# Patient Record
Sex: Female | Born: 1980 | Race: White | Hispanic: No | Marital: Married | Smoking: Never smoker
Health system: Southern US, Community
[De-identification: ages and names within clinical notes are randomized; demographics above are authoritative.]

## PROBLEM LIST (undated history)

## (undated) ENCOUNTER — Inpatient Hospital Stay (HOSPITAL_COMMUNITY): Payer: Self-pay

## (undated) DIAGNOSIS — I499 Cardiac arrhythmia, unspecified: Secondary | ICD-10-CM

## (undated) DIAGNOSIS — O021 Missed abortion: Secondary | ICD-10-CM

## (undated) DIAGNOSIS — E039 Hypothyroidism, unspecified: Secondary | ICD-10-CM

## (undated) DIAGNOSIS — Z789 Other specified health status: Secondary | ICD-10-CM

---

## 2013-03-06 ENCOUNTER — Encounter (HOSPITAL_BASED_OUTPATIENT_CLINIC_OR_DEPARTMENT_OTHER): Payer: Self-pay | Admitting: *Deleted

## 2013-03-06 NOTE — Progress Notes (Addendum)
NPO AFTER MN. ARRIVE AT 0600. PRE-ORDERS PENDING.  NEEDS HG.

## 2013-03-07 NOTE — H&P (Signed)
NAME:  TRAYCE, CARAVELLO NO.:  [id number]  MEDICAL RECORD NO.:  [id number]  LOCATION:                                 FACILITY:  PHYSICIAN:  Zelphia Cairo, MD         DATE OF BIRTH:  DATE OF ADMISSION: DATE OF DISCHARGE:                             HISTORY & PHYSICAL   HISTORY OF PRESENT ILLNESS:  A 32 year old, G1, P0, at approximately [redacted] weeks gestation by date presents for surgical management of missed abortion.  PAST MEDICAL HISTORY:  Negative.  PAST SURGICAL HISTORY:  Negative.  ALLERGIES:  PENICILLIN.  FAMILY HISTORY:  Melanoma, lung cancer, diabetes and hypertension.  MEDICATIONS:  Prenatal vitamin.  SOCIAL HISTORY:  Negative for tobacco, alcohol, and drug use.  PHYSICAL EXAMINATION:  VITAL SIGNS:  Afebrile.  Vital signs stable. Blood pressure 102/62.  Blood type B positive, antibody negative. GENERAL:  She is in no acute distress. HEART:  Regular rate and rhythm. LUNGS:  Clear bilaterally. ABDOMEN:  Soft, nontender, nondistended. PELVIC:  Deferred. EXTREMITIES:  Without clubbing, cyanosis, or edema.  Ultrasound shows intrauterine pregnancy measuring 8 weeks and 5 days. No cardiac motion is identified.  ASSESSMENT:  Missed abortion.  PLAN:  Dilation and evacuation.  Risks benefits, alternatives, and implications to future pregnancies were reviewed.  Informed consent obtained.     Zelphia Cairo, MD     GA/MEDQ  D:  03/06/2013  T:  03/07/2013  Job:  [id number]

## 2013-03-09 ENCOUNTER — Encounter (HOSPITAL_BASED_OUTPATIENT_CLINIC_OR_DEPARTMENT_OTHER)
Admission: RE | Disposition: A | Discharge status: Home / Self Care | Payer: Self-pay | Source: Ambulatory Visit | Attending: Obstetrics and Gynecology

## 2013-03-09 ENCOUNTER — Ambulatory Visit (HOSPITAL_BASED_OUTPATIENT_CLINIC_OR_DEPARTMENT_OTHER)
Admission: RE | Admit: 2013-03-09 | Discharge: 2013-03-09 | Disposition: A | Discharge status: Home / Self Care | Payer: No Typology Code available for payment source | Source: Ambulatory Visit | Attending: Obstetrics and Gynecology | Admitting: Obstetrics and Gynecology

## 2013-03-09 ENCOUNTER — Ambulatory Visit (HOSPITAL_BASED_OUTPATIENT_CLINIC_OR_DEPARTMENT_OTHER): Payer: No Typology Code available for payment source | Admitting: Anesthesiology

## 2013-03-09 ENCOUNTER — Encounter (HOSPITAL_BASED_OUTPATIENT_CLINIC_OR_DEPARTMENT_OTHER): Payer: No Typology Code available for payment source | Admitting: Anesthesiology

## 2013-03-09 ENCOUNTER — Encounter (HOSPITAL_BASED_OUTPATIENT_CLINIC_OR_DEPARTMENT_OTHER): Payer: Self-pay | Admitting: *Deleted

## 2013-03-09 DIAGNOSIS — O021 Missed abortion: Secondary | ICD-10-CM | POA: Insufficient documentation

## 2013-03-09 HISTORY — PX: DILATION AND EVACUATION: SHX1459

## 2013-03-09 HISTORY — DX: Missed abortion: O02.1

## 2013-03-09 LAB — POCT HEMOGLOBIN-HEMACUE: Hemoglobin: 14.1 g/dL (ref 12.0–15.0)

## 2013-03-09 SURGERY — DILATION AND EVACUATION, UTERUS
Anesthesia: General | Site: Uterus | Wound class: Clean Contaminated

## 2013-03-09 MED ORDER — LACTATED RINGERS IV SOLN
INTRAVENOUS | Status: DC
  Administered 2013-03-09: 06:00:00 via INTRAVENOUS
  Filled 2013-03-09: qty 1000

## 2013-03-09 MED ORDER — FENTANYL CITRATE 0.05 MG/ML IJ SOLN
25.0000 ug | INTRAMUSCULAR | Status: DC | PRN
  Filled 2013-03-09: qty 1

## 2013-03-09 MED ORDER — PROPOFOL 10 MG/ML IV BOLUS
INTRAVENOUS | Status: DC | PRN
  Administered 2013-03-09: 180 mg via INTRAVENOUS

## 2013-03-09 MED ORDER — FENTANYL CITRATE 0.05 MG/ML IJ SOLN
INTRAMUSCULAR | Status: DC | PRN
  Administered 2013-03-09: 50 ug via INTRAVENOUS

## 2013-03-09 MED ORDER — METHYLERGONOVINE MALEATE 0.2 MG PO TABS: 0.2000 mg | ORAL_TABLET | Freq: Three times a day (TID) | ORAL | Status: DC

## 2013-03-09 MED ORDER — DEXAMETHASONE SODIUM PHOSPHATE 4 MG/ML IJ SOLN
INTRAMUSCULAR | Status: DC | PRN
  Administered 2013-03-09: 8 mg via INTRAVENOUS

## 2013-03-09 MED ORDER — MIDAZOLAM HCL 5 MG/5ML IJ SOLN
INTRAMUSCULAR | Status: DC | PRN
  Administered 2013-03-09: 2 mg via INTRAVENOUS

## 2013-03-09 MED ORDER — KETOROLAC TROMETHAMINE 30 MG/ML IJ SOLN
INTRAMUSCULAR | Status: DC | PRN
  Administered 2013-03-09: 30 mg via INTRAVENOUS

## 2013-03-09 MED ORDER — ONDANSETRON HCL 4 MG/2ML IJ SOLN
INTRAMUSCULAR | Status: DC | PRN
  Administered 2013-03-09: 4 mg via INTRAMUSCULAR

## 2013-03-09 MED ORDER — CLINDAMYCIN PHOSPHATE 900 MG/50ML IV SOLN
900.0000 mg | Freq: Once | INTRAVENOUS | Status: DC
  Filled 2013-03-09: qty 50

## 2013-03-09 MED ORDER — LACTATED RINGERS IV SOLN
INTRAVENOUS | Status: DC
  Filled 2013-03-09: qty 1000

## 2013-03-09 MED ORDER — CHLOROPROCAINE HCL 1 % IJ SOLN
INTRAMUSCULAR | Status: DC | PRN
  Administered 2013-03-09: 10 mL

## 2013-03-09 MED ORDER — HYDROCODONE-IBUPROFEN 5-200 MG PO TABS: 1.0000 | ORAL_TABLET | Freq: Four times a day (QID) | ORAL | Status: AC | PRN

## 2013-03-09 MED ORDER — LIDOCAINE HCL (CARDIAC) 20 MG/ML IV SOLN
INTRAVENOUS | Status: DC | PRN
  Administered 2013-03-09: 60 mg via INTRAVENOUS

## 2013-03-09 SURGICAL SUPPLY — 23 items
CATH ROBINSON RED A/P 16FR (CATHETERS) ×2 IMPLANT
CLOTH BEACON ORANGE TIMEOUT ST (SAFETY) ×2 IMPLANT
COVER TABLE BACK 60X90 (DRAPES) ×2 IMPLANT
DRAPE LG THREE QUARTER DISP (DRAPES) ×2 IMPLANT
DRESSING TELFA 8X3 (GAUZE/BANDAGES/DRESSINGS) ×2 IMPLANT
GLOVE BIO SURGEON STRL SZ 6.5 (GLOVE) ×2 IMPLANT
GLOVE BIOGEL M 6.5 STRL (GLOVE) ×4 IMPLANT
GLOVE BIOGEL M STER SZ 6 (GLOVE) ×2 IMPLANT
GLOVE INDICATOR 7.0 STRL GRN (GLOVE) ×4 IMPLANT
GOWN PREVENTION PLUS LG XLONG (DISPOSABLE) ×2 IMPLANT
GOWN STRL REIN XL XLG (GOWN DISPOSABLE) ×2 IMPLANT
LEGGING LITHOTOMY PAIR STRL (DRAPES) ×2 IMPLANT
NEEDLE SPNL 22GX3.5 QUINCKE BK (NEEDLE) ×2 IMPLANT
NS IRRIG 500ML POUR BTL (IV SOLUTION) ×2 IMPLANT
PACK BASIN DAY SURGERY FS (CUSTOM PROCEDURE TRAY) ×2 IMPLANT
PAD OB MATERNITY 4.3X12.25 (PERSONAL CARE ITEMS) ×2 IMPLANT
PAD PREP 24X48 CUFFED NSTRL (MISCELLANEOUS) ×2 IMPLANT
SURGILUBE 2OZ TUBE FLIPTOP (MISCELLANEOUS) ×2 IMPLANT
SYR CONTROL 10ML LL (SYRINGE) ×2 IMPLANT
TOWEL OR 17X24 6PK STRL BLUE (TOWEL DISPOSABLE) ×2 IMPLANT
TRAY DSU PREP LF (CUSTOM PROCEDURE TRAY) ×2 IMPLANT
VACURETTE 9 RIGID CVD (CANNULA) ×2 IMPLANT
WATER STERILE IRR 500ML POUR (IV SOLUTION) ×2 IMPLANT

## 2013-03-09 NOTE — Transfer of Care (Signed)
Immediate Anesthesia Transfer of Care Note  Patient: Annette Banks  Procedure(s) Performed: Procedure(s) (LRB): DILATATION AND EVACUATION (N/A)  Patient Location: PACU  Anesthesia Type: General  Level of Consciousness: awake, oriented, sedated and patient cooperative  Airway & Oxygen Therapy: Patient Spontanous Breathing and Patient connected to face mask oxygen  Post-op Assessment: Report given to PACU RN and Post -op Vital signs reviewed and stable  Post vital signs: Reviewed and stable  Complications: No apparent anesthesia complications

## 2013-03-09 NOTE — Op Note (Signed)
NAMEMekaela, Annette Banks NO.:  [id number]  MEDICAL RECORD NO.:  [id number]  LOCATION:                                 FACILITY:  PHYSICIAN:  Zelphia Cairo, MD         DATE OF BIRTH:  DATE OF PROCEDURE:  03/09/2013 DATE OF DISCHARGE:                              OPERATIVE REPORT   PREOPERATIVE DIAGNOSIS:  Missed abortion.  POSTOPERATIVE DIAGNOSIS:  Missed abortion.  PROCEDURE: 1. Cervical block. 2. Dilation and evacuation.  SURGEON:  Zelphia Cairo, MD  ANESTHESIA:  General.  COMPLICATIONS:  None.  SPECIMEN:  Products of conception.  CONDITION:  Stable to recovery room.  PROCEDURE IN DETAIL:  The patient was taken to the operating room, where general anesthesia was obtained, she was placed in the dorsal lithotomy position using Allen stirrups, prepped and draped in sterile fashion. Bivalve speculum was placed in the vagina and 1 mL of 1% Nesacaine was placed at the anterior lip of the cervix.  Tenaculum was attached to the anterior lip of the cervix and the remaining 9 mL of Nesacaine was used to perform a cervical block.  The cervix was easily dilated and a 9- French suction catheter was used to perform D any E.  Once no further tissue was being obtained, gentle curetting was performed to ensure uterine cry throughout.  The suction curette was introduced one last time to remove any clots and debris.  No further tissue was obtained. Tenaculum was removed from the cervix.  The cervix was hemostatic. Speculum was removed.  The patient was extubated and taken to the recovery room in stable condition.  Sponge, lap, needle, and instrument counts were correct x2.     Zelphia Cairo, MD     GA/MEDQ  D:  03/09/2013  T:  03/09/2013  Job:  [id number]

## 2013-03-09 NOTE — Anesthesia Postprocedure Evaluation (Signed)
  Anesthesia Post-op Note  Patient: Annette Banks  Procedure(s) Performed: Procedure(s) (LRB): DILATATION AND EVACUATION (N/A)  Patient Location: PACU  Anesthesia Type: General  Level of Consciousness: awake and alert   Airway and Oxygen Therapy: Patient Spontanous Breathing  Post-op Pain: mild  Post-op Assessment: Post-op Vital signs reviewed, Patient's Cardiovascular Status Stable, Respiratory Function Stable, Patent Airway and No signs of Nausea or vomiting  Last Vitals:  Filed Vitals:   03/09/13 0810  BP:   Pulse: 65  Temp:   Resp: 15    Post-op Vital Signs: stable   Complications: No apparent anesthesia complications

## 2013-03-09 NOTE — Progress Notes (Signed)
H&P reviewed and unchanged.

## 2013-03-09 NOTE — Anesthesia Preprocedure Evaluation (Addendum)

## 2013-03-09 NOTE — Anesthesia Procedure Notes (Signed)
Procedure Name: LMA Insertion Date/Time: 03/09/2013 7:31 AM Performed by: Renella Cunas D Pre-anesthesia Checklist: Patient identified, Emergency Drugs available, Suction available and Patient being monitored Patient Re-evaluated:Patient Re-evaluated prior to inductionOxygen Delivery Method: Circle System Utilized Preoxygenation: Pre-oxygenation with 100% oxygen Intubation Type: IV induction Ventilation: Mask ventilation without difficulty LMA: LMA inserted LMA Size: 4.0 Number of attempts: 1 Airway Equipment and Method: bite block Placement Confirmation: positive ETCO2 Tube secured with: Tape Dental Injury: Teeth and Oropharynx as per pre-operative assessment

## 2013-03-10 ENCOUNTER — Encounter (HOSPITAL_BASED_OUTPATIENT_CLINIC_OR_DEPARTMENT_OTHER): Payer: Self-pay | Admitting: Obstetrics and Gynecology

## 2013-03-10 ENCOUNTER — Ambulatory Visit: Admit: 2013-03-10 | Payer: Self-pay | Admitting: Obstetrics and Gynecology

## 2013-03-10 SURGERY — DILATION AND EVACUATION, UTERUS
Anesthesia: Choice

## 2013-11-01 ENCOUNTER — Encounter (HOSPITAL_COMMUNITY): Payer: Self-pay | Admitting: *Deleted

## 2013-11-01 ENCOUNTER — Inpatient Hospital Stay (HOSPITAL_COMMUNITY)
Admission: AD | Admit: 2013-11-01 | Discharge: 2013-11-01 | Disposition: A | Discharge status: Home / Self Care | Payer: No Typology Code available for payment source | Source: Ambulatory Visit | Attending: Obstetrics and Gynecology | Admitting: Obstetrics and Gynecology

## 2013-11-01 ENCOUNTER — Inpatient Hospital Stay (HOSPITAL_COMMUNITY): Payer: No Typology Code available for payment source

## 2013-11-01 DIAGNOSIS — O209 Hemorrhage in early pregnancy, unspecified: Secondary | ICD-10-CM

## 2013-11-01 DIAGNOSIS — O208 Other hemorrhage in early pregnancy: Secondary | ICD-10-CM | POA: Insufficient documentation

## 2013-11-01 HISTORY — DX: Other specified health status: Z78.9

## 2013-11-01 NOTE — MAU Note (Signed)
PT  SAYS SHE STARTED VAG  BLEEDING AT   0045-   ALSO HAD  SMALL AMT ON TOILET PAPER BEFORE THIS.    IN OFFICE   ON 6-4-  U/S- ALL OK.   NO CRAMPING.    IN TRIAGE -  SMALL AMT   STREAK.    LAST SEX-   8  WEEKS AGO.

## 2013-11-01 NOTE — Discharge Instructions (Signed)
Vaginal Bleeding During Pregnancy, First Trimester °A small amount of bleeding (spotting) from the vagina is relatively common in early pregnancy. It usually stops on its own. Various things may cause bleeding or spotting in early pregnancy. Some bleeding may be related to the pregnancy, and some may not. In most cases, the bleeding is normal and is not a problem. However, bleeding can also be a sign of something serious. Be sure to tell your health care provider about any vaginal bleeding right away. °Some possible causes of vaginal bleeding during the first trimester include: °· Infection or inflammation of the cervix. °· Growths (polyps) on the cervix. °· Miscarriage or threatened miscarriage. °· Pregnancy tissue has developed outside of the uterus and in a fallopian tube (tubal pregnancy). °· Tiny cysts have developed in the uterus instead of pregnancy tissue (molar pregnancy). °HOME CARE INSTRUCTIONS  °Watch your condition for any changes. The following actions may help to lessen any discomfort you are feeling: °· Follow your health care provider's instructions for limiting your activity. If your health care provider orders bed rest, you may need to stay in bed and only get up to use the bathroom. However, your health care provider may allow you to continue light activity. °· If needed, make plans for someone to help with your regular activities and responsibilities while you are on bed rest. °· Keep track of the number of pads you use each day, how often you change pads, and how soaked (saturated) they are. Write this down. °· Do not use tampons. Do not douche. °· Do not have sexual intercourse or orgasms until approved by your health care provider. °· If you pass any tissue from your vagina, save the tissue so you can show it to your health care provider. °· Only take over-the-counter or prescription medicines as directed by your health care provider. °· Do not take aspirin because it can make you  bleed. °· Keep all follow-up appointments as directed by your health care provider. °SEEK MEDICAL CARE IF: °· You have any vaginal bleeding during any part of your pregnancy. °· You have cramps or labor pains. °SEEK IMMEDIATE MEDICAL CARE IF:  °· You have severe cramps in your back or belly (abdomen). °· You have a fever, not controlled by medicine. °· You pass large clots or tissue from your vagina. °· Your bleeding increases. °· You feel lightheaded or weak, or you have fainting episodes. °· You have chills. °· You are leaking fluid or have a gush of fluid from your vagina. °· You pass out while having a bowel movement. °MAKE SURE YOU: °· Understand these instructions. °· Will watch your condition. °· Will get help right away if you are not doing well or get worse. °Document Released: 02/21/2005 Document Revised: 03/04/2013 Document Reviewed: 01/19/2013 °Exit[employer]® Patient Information ©2014 [employer]. ° °

## 2013-11-01 NOTE — MAU Provider Note (Signed)
History     CSN: [id number]  Arrival date and time: 11/01/13 0215   None     No chief complaint on file.  HPI This is a 33 y.o. female at 3164w4d who presents with c/o red vaginal bleeding at home. Has had one episode of spotting before but this filled toilet. Has no pain. Is also concerned over intermittent episodes of heart fluttering. It is not happening tonight, but has been worried. Her doctor has talked about referring her to cardiology. Worried the palpitations will hurt the baby.  She is tearful when describing this, and states "I am just so scared about everything this pregnancy".   Rn Note PT SAYS SHE STARTED VAG BLEEDING AT 0045- ALSO HAD SMALL AMT ON TOILET PAPER BEFORE THIS. IN OFFICE ON 6-4- U/S- ALL OK. NO CRAMPING. IN TRIAGE - SMALL AMT STREAK. LAST SEX- 8 WEEKS AGO.        OB History   Grav Para Term Preterm Abortions TAB SAB Ect Mult Living   2    1  1    0      Past Medical History  Diagnosis Date  . Missed abortion   . Medical history non-contributory     Past Surgical History  Procedure Laterality Date  . Dilation and evacuation N/A 03/09/2013    Procedure: DILATATION AND EVACUATION;  Surgeon: Annette CairoGretchen Adkins, MD;  [facility] [place];  Service: Gynecology;  Laterality: N/A;    History reviewed. No pertinent family history.  History  Substance Use Topics  . Smoking status: Never Smoker   . Smokeless tobacco: Never Used  . Alcohol Use: No    Allergies:  Allergies  Allergen Reactions  . Penicillins Other (See Comments)    SEIZURE AS CHILD    Prescriptions prior to admission  Medication Sig Dispense Refill  . hydrocodone-ibuprofen (VICOPROFEN) 5-200 MG per tablet Take 1-2 tablets by mouth every 6 (six) hours as needed for pain.  30 tablet  0  . methylergonovine (METHERGINE) 0.2 MG tablet Take 1 tablet (0.2 mg total) by mouth every 8 (eight) hours.  9 tablet  0    Review of Systems  Constitutional: Negative for fever, chills  and malaise/fatigue.  Gastrointestinal: Negative for nausea, vomiting and abdominal pain.  Genitourinary:       Vaginal bleeding   Neurological: Negative for dizziness.   Physical Exam   Blood pressure 110/59, pulse 84, temperature 98.7 F (37.1 C), temperature source Oral, resp. rate 18, height 5' (1.524 m), weight 60.952 kg (134 lb 6 oz), last menstrual period 09/02/2013.  Physical Exam  Constitutional: She is oriented to person, place, and time. She appears well-developed and well-nourished. No distress.  Cardiovascular: Normal rate, regular rhythm and normal heart sounds.  Exam reveals no gallop and no friction rub.   No murmur heard. Respiratory: Effort normal. No respiratory distress. She has no wheezes. She has no rales.  GI: Soft. There is no tenderness.  Genitourinary: Uterus normal. Vaginal discharge (small to moderate clotted blood in vault) found.  Musculoskeletal: Normal range of motion.  Neurological: She is alert and oriented to person, place, and time.  Skin: Skin is warm and dry.  Psychiatric: She has a normal mood and affect.    MAU Course  Procedures  MDM [place] Ob Comp Less 14 Wks  11/01/2013   CLINICAL DATA:  Vaginal bleeding.  EXAM: OBSTETRIC <14 WK US AND TRANSVAGINAL OB US  TECHNIQUE: Both transabdominal and transvaginal ultrasound examinations were performed for complete  evaluation of the gestation as well as the maternal uterus, adnexal regions, and pelvic cul-de-sac. Transvaginal technique was performed to assess early pregnancy.  COMPARISON:  None.    FINDINGS: Intrauterine gestational sac: Visualized/normal in shape.  Yolk sac:  Present  Embryo:  Present  Cardiac Activity: Present  Heart Rate:  170 bpm  MSD:    mm    w     d  CRL:   17.2  mm   8 w 2 d                  [place] EDC: 06/11/2014  Maternal uterus/adnexae:  Small subchorionic hemorrhage.  Normal right ovary.  Corpus luteum cyst noted.  Normal left ovary.  No free pelvic fluid.    IMPRESSION: Single  living intrauterine fetus estimated at 8 weeks and 2 days gestation.  Small subchorionic hemorrhage.  Normal ovaries.   Electronically Signed   By: Annette Banks M.D.   On: 11/01/2013 04:48    Assessment and Plan  A;  SIUP at [redacted]w[redacted]d        Small Subchorionic Hemorrhage  P;  Discussed with pt and husband in detail       Pelvic rest       Has appt this week with nurse       Followup in office  Annette Banks 11/01/2013, 3:05 AM

## 2013-11-02 LAB — OB RESULTS CONSOLE RUBELLA ANTIBODY, IGM: RUBELLA: IMMUNE

## 2013-11-02 LAB — OB RESULTS CONSOLE ABO/RH: RH Type: POSITIVE

## 2013-11-02 LAB — OB RESULTS CONSOLE GC/CHLAMYDIA
Chlamydia: NEGATIVE
Gonorrhea: NEGATIVE

## 2013-11-02 LAB — OB RESULTS CONSOLE HEPATITIS B SURFACE ANTIGEN: HEP B S AG: NEGATIVE

## 2013-11-02 LAB — OB RESULTS CONSOLE RPR: RPR: NONREACTIVE

## 2013-11-02 LAB — OB RESULTS CONSOLE HIV ANTIBODY (ROUTINE TESTING): HIV: NONREACTIVE

## 2013-11-02 LAB — OB RESULTS CONSOLE ANTIBODY SCREEN: Antibody Screen: NEGATIVE

## 2014-03-29 ENCOUNTER — Encounter (HOSPITAL_COMMUNITY): Payer: Self-pay | Admitting: *Deleted

## 2014-06-07 ENCOUNTER — Encounter (HOSPITAL_COMMUNITY): Payer: Self-pay | Admitting: *Deleted

## 2014-06-07 ENCOUNTER — Telehealth (HOSPITAL_COMMUNITY): Payer: Self-pay | Admitting: *Deleted

## 2014-06-07 LAB — OB RESULTS CONSOLE GBS: GBS: POSITIVE

## 2014-06-07 NOTE — Telephone Encounter (Signed)
Preadmission screen  

## 2014-06-11 ENCOUNTER — Inpatient Hospital Stay (HOSPITAL_COMMUNITY): Payer: PRIVATE HEALTH INSURANCE | Admitting: Anesthesiology

## 2014-06-11 ENCOUNTER — Encounter (HOSPITAL_COMMUNITY): Payer: Self-pay | Admitting: *Deleted

## 2014-06-11 ENCOUNTER — Inpatient Hospital Stay (HOSPITAL_COMMUNITY)
Admission: AD | Admit: 2014-06-11 | Discharge: 2014-06-13 | DRG: 989 | Disposition: A | Discharge status: Home / Self Care | Payer: PRIVATE HEALTH INSURANCE | Source: Ambulatory Visit | Attending: Obstetrics and Gynecology | Admitting: Obstetrics and Gynecology

## 2014-06-11 ENCOUNTER — Inpatient Hospital Stay (HOSPITAL_COMMUNITY): Admission: RE | Admit: 2014-06-11 | Payer: No Typology Code available for payment source | Source: Ambulatory Visit

## 2014-06-11 DIAGNOSIS — Z3A40 40 weeks gestation of pregnancy: Secondary | ICD-10-CM | POA: Diagnosis present

## 2014-06-11 DIAGNOSIS — E039 Hypothyroidism, unspecified: Secondary | ICD-10-CM | POA: Diagnosis present

## 2014-06-11 DIAGNOSIS — O99284 Endocrine, nutritional and metabolic diseases complicating childbirth: Secondary | ICD-10-CM | POA: Diagnosis present

## 2014-06-11 DIAGNOSIS — O99824 Streptococcus B carrier state complicating childbirth: Secondary | ICD-10-CM | POA: Diagnosis present

## 2014-06-11 DIAGNOSIS — Z8249 Family history of ischemic heart disease and other diseases of the circulatory system: Secondary | ICD-10-CM

## 2014-06-11 DIAGNOSIS — Z833 Family history of diabetes mellitus: Secondary | ICD-10-CM | POA: Diagnosis not present

## 2014-06-11 HISTORY — DX: Hypothyroidism, unspecified: E03.9

## 2014-06-11 HISTORY — DX: Cardiac arrhythmia, unspecified: I49.9

## 2014-06-11 LAB — CBC
HEMATOCRIT: 36.6 % (ref 36.0–46.0)
HEMOGLOBIN: 12.7 g/dL (ref 12.0–15.0)
MCH: 30.2 pg (ref 26.0–34.0)
MCHC: 34.7 g/dL (ref 30.0–36.0)
MCV: 87.1 fL (ref 78.0–100.0)
Platelets: 299 10*3/uL (ref 150–400)
RBC: 4.2 MIL/uL (ref 3.87–5.11)
RDW: 13.6 % (ref 11.5–15.5)
WBC: 13.5 10*3/uL — ABNORMAL HIGH (ref 4.0–10.5)

## 2014-06-11 LAB — TYPE AND SCREEN
ABO/RH(D): B POS
ANTIBODY SCREEN: NEGATIVE

## 2014-06-11 LAB — POCT FERN TEST: POCT Fern Test: POSITIVE

## 2014-06-11 LAB — ABO/RH: ABO/RH(D): B POS

## 2014-06-11 MED ORDER — DIPHENHYDRAMINE HCL 50 MG/ML IJ SOLN: 12.5000 mg | INTRAMUSCULAR | Status: DC | PRN

## 2014-06-11 MED ORDER — ONDANSETRON HCL 4 MG PO TABS: 4.0000 mg | ORAL_TABLET | ORAL | Status: DC | PRN

## 2014-06-11 MED ORDER — SENNOSIDES-DOCUSATE SODIUM 8.6-50 MG PO TABS
2.0000 | ORAL_TABLET | ORAL | Status: DC
  Administered 2014-06-11 – 2014-06-12 (×2): 2 via ORAL
  Filled 2014-06-11 (×2): qty 2

## 2014-06-11 MED ORDER — LEVOTHYROXINE SODIUM 50 MCG PO TABS
50.0000 ug | ORAL_TABLET | Freq: Every day | ORAL | Status: DC
  Administered 2014-06-12 – 2014-06-13 (×2): 50 ug via ORAL
  Filled 2014-06-11 (×3): qty 1

## 2014-06-11 MED ORDER — PRENATAL MULTIVITAMIN CH
1.0000 | ORAL_TABLET | Freq: Every day | ORAL | Status: DC
  Administered 2014-06-12 – 2014-06-13 (×2): 1 via ORAL
  Filled 2014-06-11 (×2): qty 1

## 2014-06-11 MED ORDER — LEVOTHYROXINE SODIUM 50 MCG PO TABS
50.0000 ug | ORAL_TABLET | Freq: Every day | ORAL | Status: DC
  Administered 2014-06-11: 50 ug via ORAL
  Filled 2014-06-11: qty 1

## 2014-06-11 MED ORDER — OXYTOCIN BOLUS FROM INFUSION
500.0000 mL | INTRAVENOUS | Status: DC
Start: 2014-06-11 — End: 2014-06-11
  Administered 2014-06-11: 500 mL via INTRAVENOUS

## 2014-06-11 MED ORDER — VANCOMYCIN HCL IN DEXTROSE 1-5 GM/200ML-% IV SOLN
1000.0000 mg | Freq: Two times a day (BID) | INTRAVENOUS | Status: DC
  Administered 2014-06-11: 1000 mg via INTRAVENOUS
  Filled 2014-06-11 (×2): qty 200

## 2014-06-11 MED ORDER — OXYCODONE-ACETAMINOPHEN 5-325 MG PO TABS: 1.0000 | ORAL_TABLET | ORAL | Status: DC | PRN

## 2014-06-11 MED ORDER — IBUPROFEN 100 MG/5ML PO SUSP
600.0000 mg | Freq: Four times a day (QID) | ORAL | Status: DC
  Administered 2014-06-12 – 2014-06-13 (×6): 600 mg via ORAL
  Filled 2014-06-11 (×10): qty 30

## 2014-06-11 MED ORDER — MEASLES, MUMPS & RUBELLA VAC ~~LOC~~ INJ
0.5000 mL | INJECTION | Freq: Once | SUBCUTANEOUS | Status: DC
  Filled 2014-06-11: qty 0.5

## 2014-06-11 MED ORDER — LACTATED RINGERS IV SOLN
500.0000 mL | INTRAVENOUS | Status: DC | PRN
  Administered 2014-06-11: 500 mL via INTRAVENOUS

## 2014-06-11 MED ORDER — LACTATED RINGERS IV SOLN
500.0000 mL | Freq: Once | INTRAVENOUS | Status: AC
  Administered 2014-06-11: 500 mL via INTRAVENOUS

## 2014-06-11 MED ORDER — ONDANSETRON HCL 4 MG/2ML IJ SOLN: 4.0000 mg | INTRAMUSCULAR | Status: DC | PRN

## 2014-06-11 MED ORDER — TERBUTALINE SULFATE 1 MG/ML IJ SOLN
0.2500 mg | Freq: Once | INTRAMUSCULAR | Status: AC
  Administered 2014-06-11: 0.25 mg via SUBCUTANEOUS

## 2014-06-11 MED ORDER — ONDANSETRON HCL 4 MG/2ML IJ SOLN: 4.0000 mg | Freq: Four times a day (QID) | INTRAMUSCULAR | Status: DC | PRN

## 2014-06-11 MED ORDER — FLEET ENEMA 7-19 GM/118ML RE ENEM: 1.0000 | ENEMA | RECTAL | Status: DC | PRN

## 2014-06-11 MED ORDER — IBUPROFEN 600 MG PO TABS
600.0000 mg | ORAL_TABLET | Freq: Four times a day (QID) | ORAL | Status: DC
  Administered 2014-06-11 (×2): 600 mg via ORAL
  Filled 2014-06-11 (×2): qty 1

## 2014-06-11 MED ORDER — TETANUS-DIPHTH-ACELL PERTUSSIS 5-2.5-18.5 LF-MCG/0.5 IM SUSP: 0.5000 mL | Freq: Once | INTRAMUSCULAR | Status: DC

## 2014-06-11 MED ORDER — WITCH HAZEL-GLYCERIN EX PADS: 1.0000 "application " | MEDICATED_PAD | CUTANEOUS | Status: DC | PRN

## 2014-06-11 MED ORDER — CITRIC ACID-SODIUM CITRATE 334-500 MG/5ML PO SOLN
30.0000 mL | ORAL | Status: DC | PRN
  Administered 2014-06-11: 30 mL via ORAL
  Filled 2014-06-11: qty 15

## 2014-06-11 MED ORDER — EPHEDRINE 5 MG/ML INJ: 10.0000 mg | INTRAVENOUS | Status: DC | PRN

## 2014-06-11 MED ORDER — OXYTOCIN 40 UNITS IN LACTATED RINGERS INFUSION - SIMPLE MED
62.5000 mL/h | INTRAVENOUS | Status: DC
  Filled 2014-06-11: qty 1000

## 2014-06-11 MED ORDER — BENZOCAINE-MENTHOL 20-0.5 % EX AERO
1.0000 "application " | INHALATION_SPRAY | CUTANEOUS | Status: DC | PRN
  Filled 2014-06-11 (×2): qty 56

## 2014-06-11 MED ORDER — DIBUCAINE 1 % RE OINT
1.0000 "application " | TOPICAL_OINTMENT | RECTAL | Status: DC | PRN
  Filled 2014-06-11: qty 28

## 2014-06-11 MED ORDER — OXYCODONE-ACETAMINOPHEN 5-325 MG PO TABS: 2.0000 | ORAL_TABLET | ORAL | Status: DC | PRN

## 2014-06-11 MED ORDER — MEDROXYPROGESTERONE ACETATE 150 MG/ML IM SUSP: 150.0000 mg | INTRAMUSCULAR | Status: DC | PRN

## 2014-06-11 MED ORDER — ACETAMINOPHEN 325 MG PO TABS: 650.0000 mg | ORAL_TABLET | ORAL | Status: DC | PRN

## 2014-06-11 MED ORDER — LACTATED RINGERS IV SOLN
INTRAVENOUS | Status: DC
  Administered 2014-06-11: 06:00:00 via INTRAVENOUS

## 2014-06-11 MED ORDER — DIPHENHYDRAMINE HCL 25 MG PO CAPS: 25.0000 mg | ORAL_CAPSULE | Freq: Four times a day (QID) | ORAL | Status: DC | PRN

## 2014-06-11 MED ORDER — PHENYLEPHRINE 40 MCG/ML (10ML) SYRINGE FOR IV PUSH (FOR BLOOD PRESSURE SUPPORT)
80.0000 ug | PREFILLED_SYRINGE | INTRAVENOUS | Status: DC | PRN
  Filled 2014-06-11: qty 20

## 2014-06-11 MED ORDER — LIDOCAINE HCL (PF) 1 % IJ SOLN
INTRAMUSCULAR | Status: DC | PRN
  Administered 2014-06-11 (×2): 4 mL

## 2014-06-11 MED ORDER — LIDOCAINE HCL (PF) 1 % IJ SOLN
30.0000 mL | INTRAMUSCULAR | Status: DC | PRN
  Filled 2014-06-11: qty 30

## 2014-06-11 MED ORDER — LANOLIN HYDROUS EX OINT: TOPICAL_OINTMENT | CUTANEOUS | Status: DC | PRN

## 2014-06-11 MED ORDER — TERBUTALINE SULFATE 1 MG/ML IJ SOLN
INTRAMUSCULAR | Status: AC
  Administered 2014-06-11: 0.25 mg via SUBCUTANEOUS
  Filled 2014-06-11: qty 1

## 2014-06-11 MED ORDER — FENTANYL 2.5 MCG/ML BUPIVACAINE 1/10 % EPIDURAL INFUSION (WH - ANES)
14.0000 mL/h | INTRAMUSCULAR | Status: DC | PRN
  Administered 2014-06-11 (×2): 14 mL/h via EPIDURAL
  Filled 2014-06-11 (×2): qty 125

## 2014-06-11 MED ORDER — PHENYLEPHRINE 40 MCG/ML (10ML) SYRINGE FOR IV PUSH (FOR BLOOD PRESSURE SUPPORT): 80.0000 ug | PREFILLED_SYRINGE | INTRAVENOUS | Status: DC | PRN

## 2014-06-11 MED ORDER — FENTANYL 2.5 MCG/ML BUPIVACAINE 1/10 % EPIDURAL INFUSION (WH - ANES)
INTRAMUSCULAR | Status: DC | PRN
  Administered 2014-06-11: 14 mL/h via EPIDURAL

## 2014-06-11 MED ORDER — SIMETHICONE 80 MG PO CHEW: 80.0000 mg | CHEWABLE_TABLET | ORAL | Status: DC | PRN

## 2014-06-11 MED ORDER — EPHEDRINE 5 MG/ML INJ
10.0000 mg | INTRAVENOUS | Status: DC | PRN
Start: 2014-06-11 — End: 2014-06-11

## 2014-06-11 NOTE — MAU Note (Signed)
Pt states LOF started about 12am. Denies vaginal bleeding. Only periodic tightening of abdomen. States positive fetal movement. Denies any complications with pregnancy.

## 2014-06-11 NOTE — H&P (Signed)
Marilynne DriversLisa Morioka is a 34 y.o. female presenting for labor.  No vb or lof.  Pregnancy uncomplicated.  History OB History    Gravida Para Term Preterm AB TAB SAB Ectopic Multiple Living   2    1  1    0     Past Medical History  Diagnosis Date  . Missed abortion   . Medical history non-contributory   . Hypothyroidism   . Dysrhythmia    Past Surgical History  Procedure Laterality Date  . Dilation and evacuation N/A 03/09/2013    Procedure: DILATATION AND EVACUATION;  Surgeon: Zelphia CairoGretchen Eyanna Mcgonagle, MD;  [facility] ;  Service: Gynecology;  Laterality: N/A;   Family History: family history includes Cancer in her mother and paternal grandfather; Diabetes in her maternal uncle and paternal uncle; Hypertension in her father and mother. Social History:  reports that she has never smoked. She has never used smokeless tobacco. She reports that she does not drink alcohol or use illicit drugs.   Prenatal Transfer Tool  Maternal Diabetes: No Genetic Screening: Normal Maternal Ultrasounds/Referrals: Normal Fetal Ultrasounds or other Referrals:  None Maternal Substance Abuse:  No Significant Maternal Medications:  None Significant Maternal Lab Results:  None Other Comments:  None  ROS  Dilation: 10 Effacement (%): 100 Station: +2 Exam by:: L Lamon RN Blood pressure 109/58, pulse 103, temperature 99.4 F (37.4 C), temperature source Oral, resp. rate 18, height 5\' 1"  (1.549 m), weight 73.936 kg (163 lb), last menstrual period 09/02/2013, SpO2 100 %. Exam Physical Exam  Gen - uncomfortable w/ ctx Abd - gravid, NT  EFW 8# Ext - NT Cvx 6cm on admission, C/C/+2 now  Prenatal labs: ABO, Rh: --/--/B POS, B POS (01/15 0200) Antibody: NEG (01/15 0200) Rubella: Immune (06/08 0000) RPR: Nonreactive (06/08 0000)  HBsAg: Negative (06/08 0000)  HIV: Non-reactive (06/08 0000)  GBS: Positive (01/11 0000)   Assessment/Plan: Admit Vancomycin Exp  mngt   Zed Wanninger 06/11/2014, 7:51 AM

## 2014-06-11 NOTE — Lactation Note (Signed)
This note was copied from the chart of Annette Marilynne DriversLisa Watterson. Lactation Consultation Note  Patient Name: Annette Banks ZOXWR'UToday's Date: 06/11/2014 Reason for consult: Initial assessment Mom called for assist with latch. Baby sleepy at this visit, but LC demonstrated awakening techniques. With suck exam, baby tongue thrusting and not interested in suckling. After several attempts at latching, baby took few suckles but sleepy at the breast. Encouraged Mom to keep baby STS. Reviewed normal newborn behaviors in the 1st 24 hours. Basic teaching reviewed and encouraged Mom to BF with feeding ques. Lactation brochure left for review, advised of OP services and support group. Encouraged Mom to call for assist as needed with latch.   Maternal Data Has patient been taught Hand Expression?: Yes  Feeding Feeding Type: Breast Fed Length of feed: 0 min  LATCH Score/Interventions Latch: Too sleepy or reluctant, no latch achieved, no sucking elicited.  Audible Swallowing: None  Type of Nipple: Everted at rest and after stimulation (short shaft)  Comfort (Breast/Nipple): Soft / non-tender     Hold (Positioning): Assistance needed to correctly position infant at breast and maintain latch. Intervention(s): Breastfeeding basics reviewed;Support Pillows;Position options;Skin to skin  LATCH Score: 5  Lactation Tools Discussed/Used WIC Program: No   Consult Status Consult Status: Follow-up Date: 06/12/14 Follow-up type: In-patient    Alfred LevinsGranger, Fidel Caggiano Ann 06/11/2014, 6:01 PM

## 2014-06-11 NOTE — Anesthesia Procedure Notes (Signed)
Epidural Patient location during procedure: OB Start time: 06/11/2014 2:50 AM End time: 06/11/2014 2:55 AM  Staffing Anesthesiologist: Felipe DroneJUDD, Soleil Mas JENNETTE Performed by: anesthesiologist   Preanesthetic Checklist Completed: patient identified, site marked, surgical consent, pre-op evaluation, timeout performed, IV checked, risks and benefits discussed and monitors and equipment checked  Epidural Patient position: sitting Prep: site prepped and draped and DuraPrep Patient monitoring: continuous pulse ox and blood pressure Approach: midline Location: L3-L4 Injection technique: LOR saline  Needle:  Needle type: Tuohy  Needle gauge: 17 G Needle length: 9 cm and 9 Needle insertion depth: 6 cm Catheter type: closed end flexible Catheter size: 19 Gauge Catheter at skin depth: 10 cm Test dose: negative  Assessment Events: blood not aspirated, injection not painful, no injection resistance, negative IV test and paresthesia (paresthesia on the L on first pass epidural placement as soon as catheter left needle tip therefore needle withdrawn and redirected, again first pass, epidural catheter threaded easily but towards end, R sided paresthesia transient, not present on injectio)  Additional Notes Patient identified. Risks/Benefits/Options discussed with patient including but not limited to bleeding, infection, nerve damage, paralysis, failed block, incomplete pain control, headache, blood pressure changes, nausea, vomiting, reactions to medication both or allergic, itching and postpartum back pain. Confirmed with bedside nurse the patient's most recent platelet count. Confirmed with patient that they are not currently taking any anticoagulation, have any bleeding history or any family history of bleeding disorders. Patient expressed understanding and wished to proceed. All questions were answered. Sterile technique was used throughout the entire procedure. Please see nursing notes for vital  signs. Test dose was given through epidural catheter and negative prior to continuing to dose epidural or start infusion. Warning signs of high block given to the patient including shortness of breath, tingling/numbness in hands, complete motor block, or any concerning symptoms with instructions to call for help. Patient was given instructions on fall risk and not to get out of bed. All questions and concerns addressed with instructions to call with any issues or inadequate analgesia.

## 2014-06-11 NOTE — Progress Notes (Addendum)
SVD of vigorous female infant w/ apgars of 8,9.  Placenta delivered manually w/ 3VC.   Partial 3rd degree lac repaired w/ chromic & 3-0 vicryl rapide.  Fundus firm.  EBL 450cc.

## 2014-06-11 NOTE — Anesthesia Preprocedure Evaluation (Signed)
Anesthesia Evaluation  Patient identified by MRN, date of birth, ID band Patient awake    Reviewed: Allergy & Precautions, NPO status , Patient's Chart, lab work & pertinent test results  History of Anesthesia Complications Negative for: history of anesthetic complications  Airway Mallampati: II  TM Distance: >3 FB Neck ROM: Full    Dental no notable dental hx. (+) Dental Advisory Given   Pulmonary neg pulmonary ROS,  breath sounds clear to auscultation  Pulmonary exam normal       Cardiovascular negative cardio ROS  Rhythm:Regular Rate:Normal  Hx of palpitations   Neuro/Psych negative neurological ROS  negative psych ROS   GI/Hepatic negative GI ROS, Neg liver ROS,   Endo/Other  Hypothyroidism obesity  Renal/GU negative Renal ROS  negative genitourinary   Musculoskeletal negative musculoskeletal ROS (+)   Abdominal   Peds negative pediatric ROS (+)  Hematology negative hematology ROS (+)   Anesthesia Other Findings   Reproductive/Obstetrics (+) Pregnancy                             Anesthesia Physical Anesthesia Plan  ASA: II  Anesthesia Plan: Epidural   Post-op Pain Management:    Induction:   Airway Management Planned:   Additional Equipment:   Intra-op Plan:   Post-operative Plan:   Informed Consent: I have reviewed the patients History and Physical, chart, labs and discussed the procedure including the risks, benefits and alternatives for the proposed anesthesia with the patient or authorized representative who has indicated his/her understanding and acceptance.   Dental advisory given  Plan Discussed with:   Anesthesia Plan Comments:         Anesthesia Quick Evaluation

## 2014-06-12 LAB — RPR: RPR Ser Ql: NONREACTIVE

## 2014-06-12 LAB — CBC
HEMATOCRIT: 25.4 % — AB (ref 36.0–46.0)
HEMOGLOBIN: 8.6 g/dL — AB (ref 12.0–15.0)
MCH: 29.8 pg (ref 26.0–34.0)
MCHC: 33.9 g/dL (ref 30.0–36.0)
MCV: 87.9 fL (ref 78.0–100.0)
Platelets: 237 10*3/uL (ref 150–400)
RBC: 2.89 MIL/uL — ABNORMAL LOW (ref 3.87–5.11)
RDW: 13.9 % (ref 11.5–15.5)
WBC: 23.1 10*3/uL — AB (ref 4.0–10.5)

## 2014-06-12 MED ORDER — FERROUS SULFATE 325 (65 FE) MG PO TABS
325.0000 mg | ORAL_TABLET | Freq: Every day | ORAL | Status: DC
  Administered 2014-06-13: 325 mg via ORAL
  Filled 2014-06-12: qty 1

## 2014-06-12 NOTE — Progress Notes (Signed)
Post Partum Day 1 Subjective: no complaints, up ad lib, voiding and tolerating PO  Objective: Blood pressure 98/50, pulse 73, temperature 98 F (36.7 C), temperature source Oral, resp. rate 18, height 5\' 1"  (1.549 m), weight 73.936 kg (163 lb), last menstrual period 09/02/2013, SpO2 99 %, unknown if currently breastfeeding.  Physical Exam:  General: alert and cooperative Lochia: appropriate Uterine Fundus: firm Incision: n/a DVT Evaluation: No evidence of DVT seen on physical exam.   Recent Labs  06/11/14 0200 06/12/14 0555  HGB 12.7 8.6*  HCT 36.6 25.4*    Assessment/Plan: Plan for discharge tomorrow   LOS: 1 day   Kaisha Wachob 06/12/2014, 9:10 AM

## 2014-06-12 NOTE — Anesthesia Postprocedure Evaluation (Signed)
  Anesthesia Post-op Note  Patient: Annette StanleyLisa Banks  Procedure(s) Performed: * No procedures listed *  Patient Location: PACU and Short Stay  Anesthesia Type:Epidural  Level of Consciousness: awake, alert  and oriented  Airway and Oxygen Therapy: Patient Spontanous Breathing  Post-op Pain: mild  Post-op Assessment: Post-op Vital signs reviewed, Patient's Cardiovascular Status Stable, Respiratory Function Stable, No signs of Nausea or vomiting, Adequate PO intake, Pain level controlled, No headache, No backache, No residual numbness and No residual motor weakness  Post-op Vital Signs: Reviewed and stable  Last Vitals:  Filed Vitals:   06/12/14 0533  BP: 98/50  Pulse: 73  Temp: 36.7 C  Resp: 18    Complications: No apparent anesthesia complications

## 2014-06-12 NOTE — Lactation Note (Signed)
This note was copied from the chart of Annette Banks [place]. Lactation Consultation Note; Called to assist mom with feeding. Mom reports baby has been very sleepy and has spit up a couples of times. Reviewed awakening techniques but baby continues to be sleepy. Mom reports he was fussing a few minutes ago and did a few sucks with the nipple shield. Encouraged to continue skin to skin and to call when baby showing feeding cues. Has DEBP set up in room. Asking about when to pump- has pumped once. Encouraged to pump q 3 hours if baby is still sleepy to promote milk supply.No further questions at present.   Patient Name: Annette Banks [place] YNWGN'FToday's Date: 06/12/2014 Reason for consult: Follow-up assessment   Maternal Data Formula Feeding for Exclusion: No Has patient been taught Hand Expression?: Yes Does the patient have breastfeeding experience prior to this delivery?: No  Feeding Feeding Type: Breast Fed  LATCH Score/Interventions Latch: Too sleepy or reluctant, no latch achieved, no sucking elicited.  Audible Swallowing: None  Type of Nipple: Everted at rest and after stimulation  Comfort (Breast/Nipple): Soft / non-tender     Hold (Positioning): Assistance needed to correctly position infant at breast and maintain latch. Intervention(s): Breastfeeding basics reviewed;Position options;Skin to skin  LATCH Score: 5  Lactation Tools Discussed/Used Pump Review: Setup, frequency, and cleaning Initiated by:: RN Date initiated:: 06/12/14   Consult Status Consult Status: Follow-up Date: 06/12/14 Follow-up type: In-patient    [name], Roanne Haye D 06/12/2014, 8:13 AM

## 2014-06-13 ENCOUNTER — Ambulatory Visit: Payer: Self-pay

## 2014-06-13 MED ORDER — IBUPROFEN 100 MG/5ML PO SUSP: 600.0000 mg | Freq: Four times a day (QID) | ORAL | Status: AC

## 2014-06-13 NOTE — Lactation Note (Signed)
This note was copied from the chart of Boy Marilynne DriversLisa Wellman. Lactation Consultation Note  Patient Name: Boy Marilynne DriversLisa Gehrig XWRUE'AToday's Date: 06/13/2014 Reason for consult: Follow-up assessment   Maternal Data    Feeding Feeding Type: Breast Fed Length of feed: 20 min  LATCH Score/Interventions Latch: Repeated attempts needed to sustain latch, nipple held in mouth throughout feeding, stimulation needed to elicit sucking reflex.  Audible Swallowing: None  Type of Nipple: Everted at rest and after stimulation  Comfort (Breast/Nipple): Soft / non-tender     Hold (Positioning): Assistance needed to correctly position infant at breast and maintain latch.  LATCH Score: 6  Lactation Tools Discussed/Used     Consult Status Consult Status: Follow-up Date: 06/14/14 Follow-up type: In-patient    Soyla DryerJoseph, Melat Wrisley 06/13/2014, 2:41 PM

## 2014-06-13 NOTE — Lactation Note (Signed)
This note was copied from the chart of Annette Marilynne DriversLisa Tuohy. Lactation Consultation Note  Mom reports that BF is going "better" but that she is not sure where she and [name] should be at this point in time.  I assisted her with latching [name] in a laid back position.  He held the nipple in his mouth but did not suckle.  I applied the #20 NS and he latched deeper.  He was more active but did not transfer any milk.  Sucking blisters were noted on his lips. His upper lip does not flange well and the frenum is thick and fleshy.  Also I noted retraction in his tongue when he tried to extend it.  A thin short membrane was felt in the anterior portion of his sublingual area.  He did not elevate his tongue well.  Dr Manson PasseyBrown was made aware of these findings.  She will check the sublingual area later today.  Patient Name: Annette Banks OZHYQ'MToday's Date: 06/13/2014     Maternal Data    Feeding Feeding Type: Breast Fed Length of feed: 10 min  LATCH Score/Interventions Latch: Repeated attempts needed to sustain latch, nipple held in mouth throughout feeding, stimulation needed to elicit sucking reflex.  Audible Swallowing: None  Type of Nipple: Everted at rest and after stimulation  Comfort (Breast/Nipple): Soft / non-tender     Hold (Positioning): Assistance needed to correctly position infant at breast and maintain latch.  LATCH Score: 6  Lactation Tools Discussed/Used     Consult Status Consult Status: Follow-up Date: 06/13/14 Follow-up type: In-patient    Soyla DryerJoseph, Rosalene Wardrop 06/13/2014, 10:56 AM

## 2014-06-13 NOTE — Lactation Note (Deleted)
This note was copied from the chart of Annette Banks. Lactation Consultation Note Mom reports that BF is going well.  Reviewed hand expression with colostrum easily expressed.  Mom denies any questions.  Aware of support group and outpatient services. Patient Name: Annette Banks GEXBM'WToday's Date: 06/13/2014     Maternal Data    Feeding Feeding Type: Breast Fed Length of feed: 5 min  LATCH Score/Interventions Latch: Repeated attempts needed to sustain latch, nipple held in mouth throughout feeding, stimulation needed to elicit sucking reflex.  Audible Swallowing: None  Type of Nipple: Everted at rest and after stimulation  Comfort (Breast/Nipple): Soft / non-tender     Hold (Positioning): Assistance needed to correctly position infant at breast and maintain latch.  LATCH Score: 6  Lactation Tools Discussed/Used     Consult Status Consult Status: Follow-up Date: 06/13/14 Follow-up type: In-patient    Soyla DryerJoseph, Zanasia Hickson 06/13/2014, 12:58 PM

## 2014-06-13 NOTE — Lactation Note (Signed)
This note was copied from the chart of Annette Banks. Lactation Consultation Note  I observed a feeding at 1400 and determined that Annette Banks was not feeding well.  I discussed supplementation with his parents as he was over 2 days of life.  Mom was not able to express more than a few drops of colostrum so it was decided,after discussing with the parents, that Alimentum would be administered via SNS behind the NS.  Mom was to begin pumping with a double electric pump at least every 3 hours.  Mom has hypothyroidism and is taking synthroid.  We discussed parameters for when she could expect her milk to increase in volume.  I assisted parents at the next feeding and taught them how to apply and set up the SNS.  Annette Banks latched to it and after adjusting it to get a seal he transferred 5 ml and detached.  Though he transferred his mouth was not gaping and he did not have long jaw excursions.  I believe the vacuum was weak and that it took a lot of effort to transfer.  The additional formula was [facility] to [facility] via finger feeder.  I showed Dad how to finger feed and do some tongue exercises to help with vacuum.  I also showed him how to advance his finger to the hard and soft palate juncture which is where the nipple needs to be positioned.  Annette Banks ate at least 20 of the 25 ml that was offered to him.  Annette Banks started to become more alert as his intake increased.  Plan  For tonight is to continue working with him and for Dr. Manson PasseyBrown or Ezequiel EssexGable to evaluate his mouth tomorrow to see if he has a tight frenulum.  Patient Name: Annette Marilynne DriversLisa Banks ZOXWR'UToday's Date: 06/13/2014 Reason for consult: Follow-up assessment   Maternal Data    Feeding Feeding Type: Formula Length of feed: 25 min  LATCH Score/Interventions Latch: Repeated attempts needed to sustain latch, nipple held in mouth throughout feeding, stimulation needed to elicit sucking reflex. Intervention(s): Skin to skin (several attempts needed to obtain a gape and  create seal)  Audible Swallowing: Spontaneous and intermittent  Type of Nipple: Everted at rest and after stimulation  Comfort (Breast/Nipple): Soft / non-tender     Hold (Positioning): Assistance needed to correctly position infant at breast and maintain latch.  LATCH Score: 8  Lactation Tools Discussed/Used Tools: Supplemental Nutrition System;Nipple Shields Nipple shield size: 20   Consult Status      Soyla DryerJoseph, Emilliano Dilworth 06/13/2014, 5:47 PM

## 2014-06-13 NOTE — Discharge Summary (Signed)
Obstetric Discharge Summary Reason for Admission: onset of labor Prenatal Procedures: ultrasound Intrapartum Procedures: spontaneous vaginal delivery Postpartum Procedures: none Complications-Operative and Postpartum: rd degree perineal laceration HEMOGLOBIN  Date Value Ref Range Status  06/12/2014 8.6* 12.0 - 15.0 g/dL Final    Comment:    REPEATED TO VERIFY DELTA CHECK NOTED    HCT  Date Value Ref Range Status  06/12/2014 25.4* 36.0 - 46.0 % Final    Physical Exam:  General: alert and cooperative Lochia: appropriate Uterine Fundus: firm Incision: n/a DVT Evaluation: No evidence of DVT seen on physical exam.  Discharge Diagnoses: Term Pregnancy-delivered  Discharge Information: Date: 06/13/2014 Activity: pelvic rest Diet: routine Medications: PNV, Ibuprofen and synthroid Condition: stable Instructions: refer to practice specific booklet Discharge to: home Follow-up Information    Schedule an appointment as soon as possible for a visit in 6 weeks to follow up.      Newborn Data: Live born female  Birth Weight: 8 lb 2.9 oz (3711 g) APGAR: 8, 9  Home with mother.  Annette Banks 06/13/2014, 8:40 AM

## 2014-06-14 ENCOUNTER — Ambulatory Visit: Payer: Self-pay

## 2014-06-14 NOTE — Lactation Note (Signed)
This note was copied from the chart of [name]. Lactation Consultation Note; Assisted mom with latch. She has been using the NS and SNS. Attempted to latch baby without NS. Baby latched well and mom reports no pain with nursing but baby now sucking very hard. Used syringe and feeding tube to supplement at the breast. Baby took 25 cc's and off to sleep. When baby came off the breast mom has positional stripe with small blister in center of stripe. Reviewed setup and cleaning of syringe./ feeding tube. Parents states they feel comfortable using this. To see Ped tomorrow. Op appointment made with us on Wed 1/20 at 9 am. Dr Manson PasseyBrown in to look at baby's frenulum.   Patient Name: Boy Marilynne DriversLisa Humbarger ZOXWR'UToday's Date: 06/14/2014 Reason for consult: Follow-up assessment   Maternal Data    Feeding Feeding Type: Breast Fed Length of feed: 15 min  LATCH Score/Interventions Latch: Grasps breast easily, tongue down, lips flanged, rhythmical sucking.  Audible Swallowing: A few with stimulation  Type of Nipple: Everted at rest and after stimulation  Comfort (Breast/Nipple): Soft / non-tender     Hold (Positioning): Assistance needed to correctly position infant at breast and maintain latch.  LATCH Score: 8  Lactation Tools Discussed/Used     Consult Status Consult Status: Follow-up Date: 06/16/14 Follow-up type: Out-patient    [name], [place] Brasington D 06/14/2014, 12:29 PM

## 2014-06-14 NOTE — Lactation Note (Signed)
This note was copied from the chart of Annette Marilynne DriversLisa [place]. Lactation Consultation Note: assisted with feeding after frenulum has been clipped. Mom reports tugging but some pain- un tucked bottom lip and mom reports that feels better. Baby sleepy at the feeding after about 10 cc's of formula. Mom pumping as I left room. No questions at present. Encouragement given. OP appointment on Wed 1/20 9 am  Patient Name: Annette Banks BJYNW'GToday's Date: 06/14/2014 Reason for consult: Follow-up assessment   Maternal Data    Feeding Feeding Type: Breast Fed Length of feed: 15 min  LATCH Score/Interventions Latch: Grasps breast easily, tongue down, lips flanged, rhythmical sucking.  Audible Swallowing: A few with stimulation (with feeding tube/ syringe)  Type of Nipple: Everted at rest and after stimulation  Comfort (Breast/Nipple): Soft / non-tender     Hold (Positioning): Assistance needed to correctly position infant at breast and maintain latch.  LATCH Score: 8  Lactation Tools Discussed/Used     Consult Status Consult Status: Follow-up Date: 06/16/14 Follow-up type: Out-patient    [name], [name] 06/14/2014, 3:19 PM

## 2014-06-14 NOTE — Lactation Note (Signed)
This note was copied from the chart of Annette Banks [place]. Lactation Consultation Note RN concerned that baby took to long to drink milk from SNS. Parents know how to set up SNS and place to the breast. Formula infusing in Tube w/o difficulty in SNS at the breast. Latched baby to Lt. Breast #16 NS had good rhythmic sucking. Discussed plan and things to look for during feeding. Encouraged breast massage during BF. Answered questions parents had. Discussed amount of feedings at hours of age. Spoke with RN about concerns. Patient Name: Annette Banks [place] ZOXWR'UToday's Date: 06/14/2014 Reason for consult: Follow-up assessment   Maternal Data    Feeding Feeding Type: Formula  LATCH Score/Interventions Latch: Grasps breast easily, tongue down, lips flanged, rhythmical sucking. Intervention(s): Skin to skin;Teach feeding cues;Waking techniques Intervention(s): Adjust position;Assist with latch;Breast massage;Breast compression  Audible Swallowing: Spontaneous and intermittent Intervention(s): Skin to skin;Hand expression  Type of Nipple: Everted at rest and after stimulation Intervention(s): Double electric pump  Comfort (Breast/Nipple): Soft / non-tender     Hold (Positioning): Assistance needed to correctly position infant at breast and maintain latch. Intervention(s): Breastfeeding basics reviewed;Support Pillows;Position options;Skin to skin  LATCH Score: 9  Lactation Tools Discussed/Used Tools: Supplemental Nutrition System;Pump Nipple shield size: 16 Breast pump type: Double-Electric Breast Pump   Consult Status Consult Status: Follow-up Date: 06/14/14 Follow-up type: In-patient    Charyl DancerCARVER, Aubrionna Istre G 06/14/2014, 7:44 AM

## 2014-06-16 ENCOUNTER — Ambulatory Visit (HOSPITAL_COMMUNITY)
Admission: RE | Admit: 2014-06-16 | Discharge: 2014-06-16 | Disposition: A | Discharge status: Home / Self Care | Payer: PRIVATE HEALTH INSURANCE | Source: Ambulatory Visit | Attending: Obstetrics and Gynecology | Admitting: Obstetrics and Gynecology

## 2014-06-16 NOTE — Lactation Note (Addendum)
Lactation Consult  Mother's reason for visit:  F/U from 1st visit after delivery in the hospital  Visit Type:  Feeding assessment  Appointment Notes:  None ( mom presented at 0900 , and she wasn't on the schedule, were able to accommodate her for feeding assessment visit )  Consult:  Initial Lactation Consultant:  Kathrin Greathouse  ________________________________________________________________________ Annette Banks Name: Annette Banks Date of Birth: 06/11/2014 Pediatrician: Dr. Sande Banks - [facility]  Gender: female Gestational Age: [redacted]w[redacted]d (At Birth) Birth Weight: 8 lb 2.9 oz (3711 g) Weight at Discharge: Weight: 7 lb 6.2 oz (3350 g)Date of Discharge: 06/14/2014 Filed Weights   06/12/14 0058 06/12/14 2346 06/14/14 0227  Weight: 7 lb 14.8 oz (3595 g) 7 lb 9.2 oz (3435 g) 7 lb 6.2 oz (3350 g)   [facility] 7-7 oz, 06/15/2014  Location:Pediatrician's office Weight today:7-7.7 oz     ________________________________________________________________________  Mother's Name: Annette Banks Type of delivery:  Vaginal delivery  Breastfeeding Experience:  Per mom challenging , but getting better , using SNS at the breast to supplement  Maternal Medical Conditions:  Thyroid ( hypothyroidism )  Maternal Medications:  PNV   ________________________________________________________________________  Breastfeeding History (Post Discharge)  Frequency of breastfeeding: 2 - 3 1/2 hours  Duration of feeding:  15 - 30 mins   Supplementing : supplementing with Similiac  per mom with formula ( and if breast milk available ) , with 5 F feeding due at the breast as shown by [facility] prior to D/C form the hospital   Pumping : Per mom with a DEBP Medela , per mom after every feeding for 10 -15 mins both breast with # 24 Flange    Infant Intake and Output Assessment  Voids:  6  in 24 hrs.  Color:  Clear yellow Stools:  0  in 24 hrs.  Color:  ( per mom baby hasn't stooled since Sunday 1/17 , and the DR. Is aware . ) Baby stooled a large stool at birth and several in the hospital. Per mom per Pedis dr. The stooling has probably slowed down due to the formula intake   ________________________________________________________________________  Maternal Breast Assessment  Breast:  Soft and Filling Nipple:  Erect Pain level:  0 Pain interventions:  Expressed breast milk  _______________________________________________________________________ Feeding Assessment/Evaluation  Initial feeding assessment:  Infant's oral assessment:  High palate noted   Positioning:  Football Left breast   LATCH documentation:  Latch:  2 = Grasps breast easily, tongue down, lips flanged, rhythmical sucking.  Audible swallowing:  2 = Spontaneous and intermittent ( added the SNS due to sluggish feeding pattern   Type of nipple:  2 = Everted at rest and after stimulation  Comfort (Breast/Nipple):  2 = Soft / non-tender to filling   Hold (Positioning):  1 = Assistance needed to correctly position infant at breast and maintain latch  LATCH score:  9  Attached assessment:  Shallow  Lips flanged:  Yes.    Lips untucked:  Yes.    Suck assessment:  Nutritive and Nonnutritive  Tools:  Syringe with 5 Fr feeding tube Instructed on use and cleaning of tool:  Yes.    Pre-feed weight:  3392 g , 7-7.7 oz  Post-feed weight:  3416 g , 7-8.5 oz  Amount transferred:  24 ml ( total )  Amount supplemented:  19 ml  Amount of breast milk transferred = was only 5 ml   Additional Feeding Assessment -  Infant's oral assessment:  Variance - high palate ( S/P anterior frenotomy in the hospital )   Positioning:  Football Right breast   LATCH documentation:  Latch:  2 = Grasps breast easily, tongue down, lips flanged, rhythmical sucking.  Audible swallowing:  2 = Spontaneous and intermittent  Type of nipple:  2 = Everted at rest and after  stimulation  Comfort (Breast/Nipple):  2 = Soft / non-tender to filling   Hold (Positioning):  1 = Assistance needed to correctly position infant at breast and maintain latch  LATCH score:   8   Attached assessment:  Shallow at 1st , LC assisted with depth , and SNS helped   Lips flanged:  Yes.    Lips untucked:  Yes.    Suck assessment:  Nutritive and Nonnutritive  Tools:  Syringe with 5 Fr feeding tube Instructed on use and cleaning of tool:  Yes.    Pre-feed weight:  3316 g , 7-8.5 oz  Post-feed weight: 3424 g , 7-8.8 oz  Amount transferred:  8 ml  Amount supplemented:  Milk transfer was all from the supplement on the right breast    Total amount pumped post feed:  Mom did not post pump   Total amount transferred: 5 ml Total supplement given:  27 ml with SNS  Total volume for feeding = 32  Ml ( , mom aware the volume needs to be increasing )  Lactation Impression: Milk is slow to come in ,  Mom is feeding with SNS at the breast and post pumping  Mom seems very tired ( encouraged  Rest ) and to bring post pumping down to 6-7 , instead of 8 -10 times a day .  [facility] feels mom isn't getting any rest ) , which important for milk let down.  Mom does report good breast changes during pregnancy - which is a good sign for milk production.  Discussed with mom and dad - goal is to gradually stretch the baby's belly o he will take for volume    Lactation Plan of care : Per  Mom F/U with Dr. Sande Banks, Magdalene Molly 1/22 for weight check - ( phone # [phone], Fax # - [phone] )  LC recommended LC WH O/P F/U for next Friday 1/29 at 4pm - for weight check and feeding assessment  Praised mom for her efforts breast feeding , and dad for his support  Mom - encouraged rest , naps , plenty fluids ,expecially water, nutritious snacks and meals  Feedings - every 2 1/2 - 3 hours and with feeding cues  Average feeding time 15 - 20 mins each breast , but there are some feedings where the bay will  only feed one breast, take a break and 1/2 - 1 hour later want more  Continue feeding diary sheets  Engorgement prevention and tx - Pages 24 in Baby and me booklet  Full = Good sign Full heading toward tight to firm to hard = engorgement requires icing prior to feeding  Important - establish and protect milk supply - if breast filling and starting to feel tight , and [name] not hungry yet, release down with hand expressing or pump for 5-7 mins  Extra pumping until milk comes in - after 5-6 feedings a day post pump 10 -15 mins - save milk and supplement back to baby  Supplement with EBM or formula until the milk comes in . 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

## 2014-06-25 ENCOUNTER — Ambulatory Visit (HOSPITAL_COMMUNITY)
Admission: RE | Admit: 2014-06-25 | Discharge: 2014-06-25 | Disposition: A | Discharge status: Home / Self Care | Payer: PRIVATE HEALTH INSURANCE | Source: Ambulatory Visit | Attending: Obstetrics and Gynecology | Admitting: Obstetrics and Gynecology

## 2014-06-25 NOTE — Lactation Note (Signed)
Lactation Consult Baby's Name: Annette CardinalKaden Biever Date of Birth: 06/11/2014 Gender: female Gestational Age: 3085w2d (At Birth) Birth Weight: 8 lb 2.9 oz (3711 g) Weight at Discharge: Weight: 7 lb 6.2 oz (3350 g)Date of Discharge: 06/14/2014 Filed Weights   06/12/14 0058 06/12/14 2346 06/14/14 0227  Weight: 7 lb 14.8 oz (3595 g) 7 lb 9.2 oz (3435 g) 7 lb 6.2 oz (3350 g)    Mother's reason for visit:  Follow up from last week Visit Type:  Feeding assessment Appointment Notes:  Baby had frenotomy here in hospital Consult:  Follow-Up Lactation Consultant:  Audry RilesWeeks, Aritzel Krusemark D  ________________________________________________________________________    ________________________________________________________________________  Mother's Name: Marilynne DriversLisa Lensing Type of delivery:   Breastfeeding Experience:  p1  ________________________________________________________________________  Breastfeeding History (Post Discharge)  Frequency of breastfeeding:  3-4 times/day Duration of feeding:  5-10 min- very fussy at the breast  Supplementation  Formula:  Volume 3 oz Frequency:  q feeding l       Brand: Similac  Breastmilk:  Volume 10-15 ml Frequency:  4 times/day \  Method:  Bottle,   Pumping  Type of pump:  Medela pump in style Frequency:  4 times/day Volume:  10-15 ml  Infant Intake and Output Assessment  Voids:  qs in 24 hrs.  Color:  Clear yellow had 2 voids while here for appointment Stools:  Qs  in 24 hrs.  Color:  Yellow  ________________________________________________________________________  Maternal Breast Assessment  Breast:  Soft Nipple:  Erect _______________________________________________________________________ Feeding Assessment/Evaluation  Initial feeding assessment:  Infant's oral assessment:  WNL had frenotomy in hospital  Positioning:  Cradle Left breast  LATCH documentation:  Latch:  2 = Grasps breast easily, tongue down, lips  flanged, rhythmical sucking.  Audible swallowing:  0 = None  Type of nipple:  2 = Everted at rest and after stimulation  Comfort (Breast/Nipple):  2 = Soft / non-tender  Hold (Positioning):  1 = Assistance needed to correctly position infant at breast and maintain latch  LATCH score:  7  Attached assessment:  Deep  Lips flanged:  Yes.    Lips untucked:  Yes.    Suck assessment:  Nonnutritive    Pre-feed weight:  3702 g  ( 8 lb. 2.6 oz.) Post-feed weight:  3704 g (8   lb. 2.7 oz.) Amount transferred:  2 ml Amount supplemented:  60 ml   Total amount transferred:  2 ml Total supplement given:  60 ml  Mom reports she is exhausted, not eating well or drinking enough and is very stressed about breast feeding. Asking about just pumping and bottle feeding any EBM. Reports [name] is fussy at the breast and doesn't stay latched on for very long. Offered assist with latch and mom agreeable. At the breast for 15 min here but mostly non nutritive only 2 cc's transferred. Took bottle well. Praise given for her efforts. Encouraged to rest as much as possible- has help at home. Plenty of small meals and fluids. Mom plans to continue pumping, skin to skin, [place]. And will bottle feed any EBM obtained. Had discussed with another LC on phone possibility of posterior tight frenulum but they have discussed it and do not want to explore that any further. No further questions at present. To call prn

## 2014-07-27 ENCOUNTER — Other Ambulatory Visit: Payer: Self-pay | Admitting: Obstetrics and Gynecology

## 2014-07-28 LAB — CYTOLOGY - PAP

## 2014-09-22 IMAGING — US US OB COMP LESS 14 WK
1 series · 14 of 28 positions shown · non-contrast
Comparison: None.

CLINICAL DATA: Vaginal bleeding.

EXAM:
OBSTETRIC <14 WK US AND TRANSVAGINAL OB US
TECHNIQUE: Both transabdominal and transvaginal ultrasound examinations were
performed for complete evaluation of the gestation as well as the
maternal uterus, adnexal regions, and pelvic cul-de-sac.
Transvaginal technique was performed to assess early pregnancy.

[Series 1: us ob comp less 14 wks · 14 of 35 slices shown]
[im 2/35]
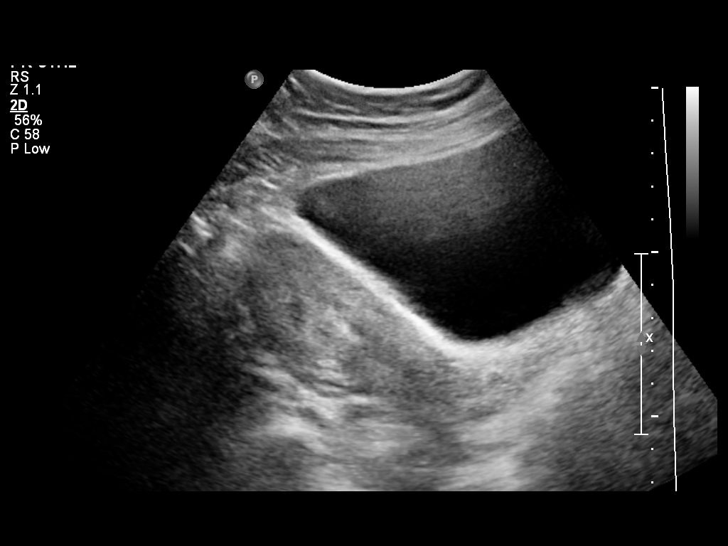
[im 4/35]
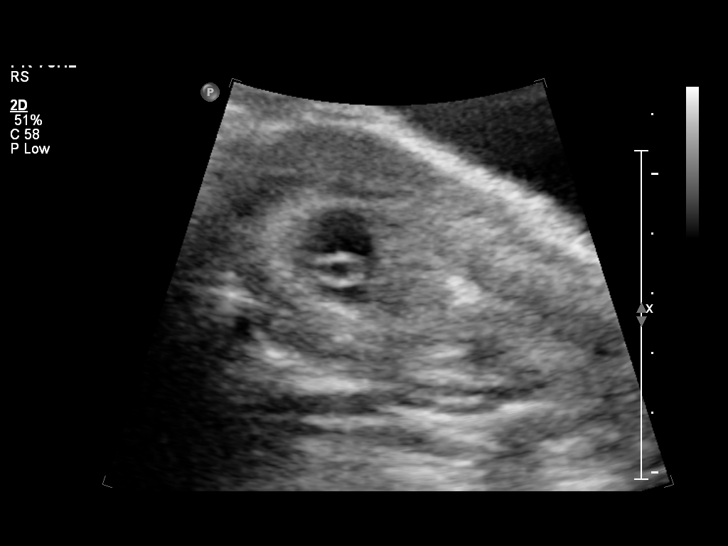
[im 7/35]
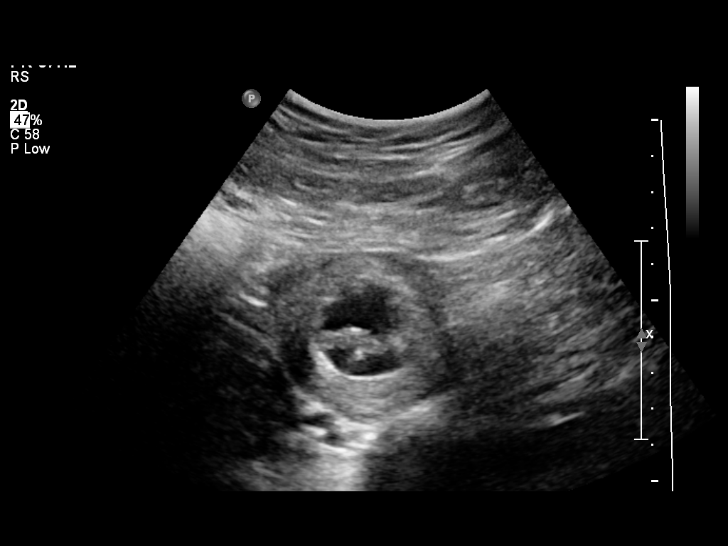
[im 9/35]
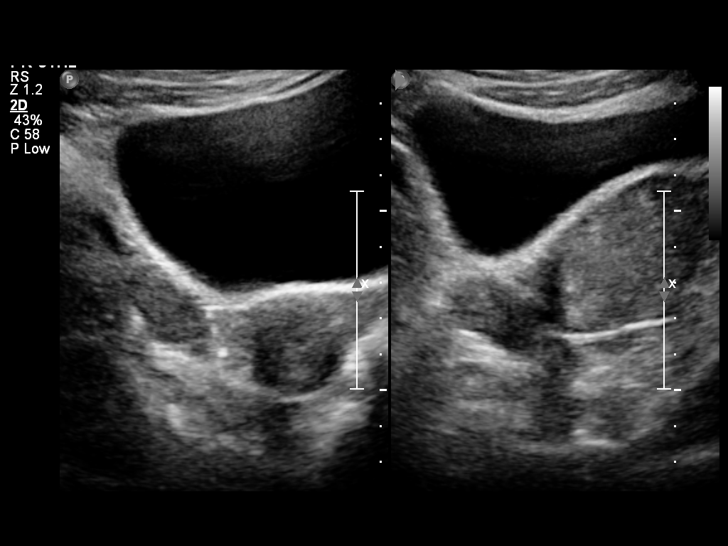
[im 12/35]
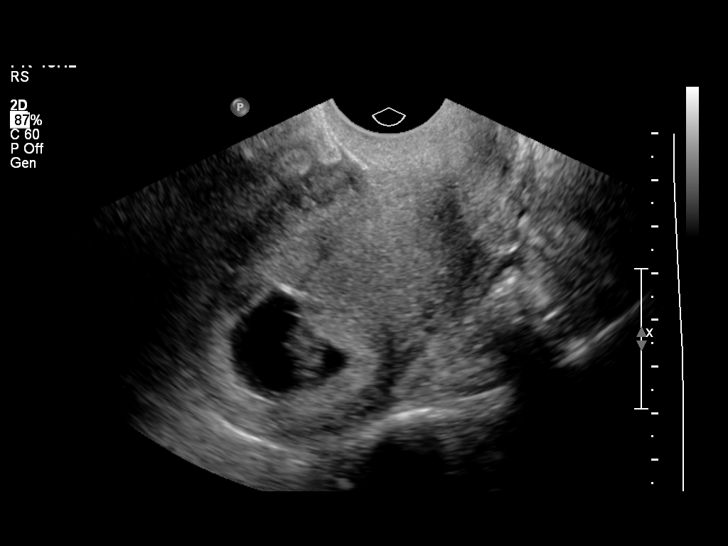
[im 14/35]
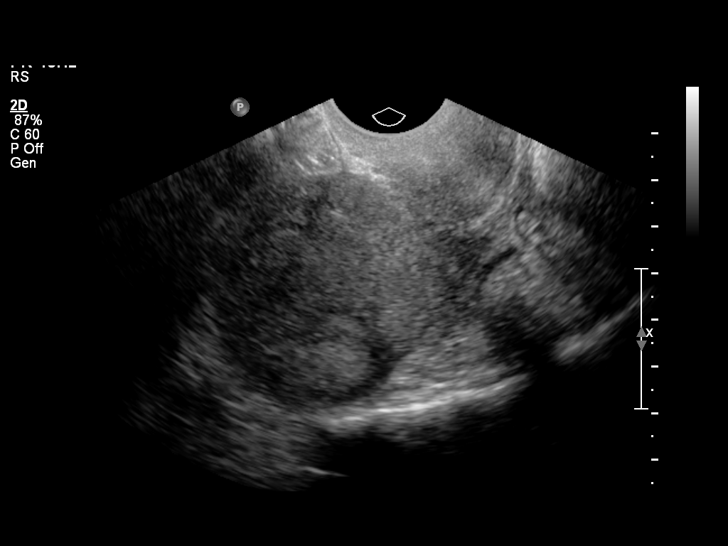
[im 17/35]
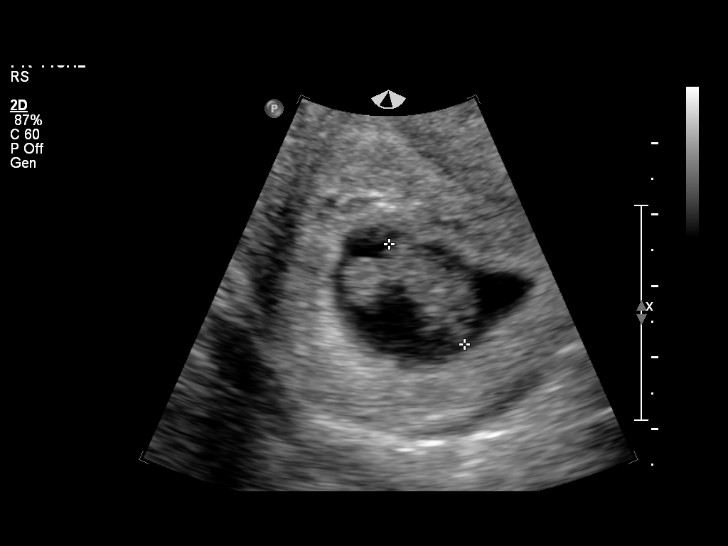
[im 19/35]
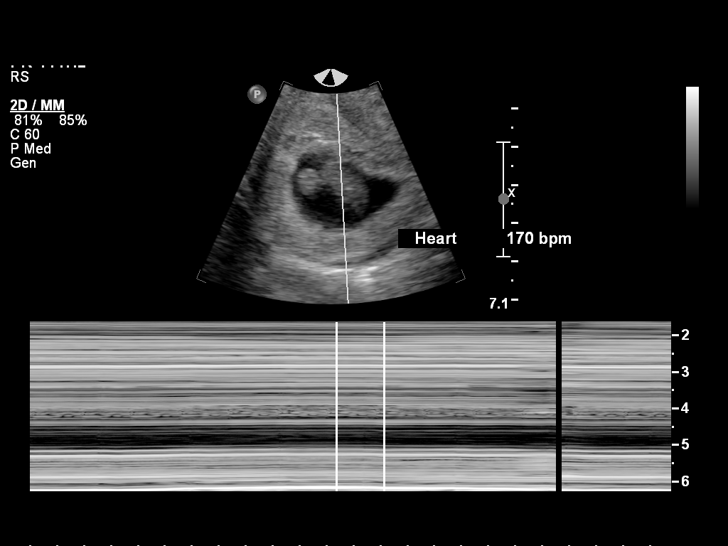
[im 22/35]
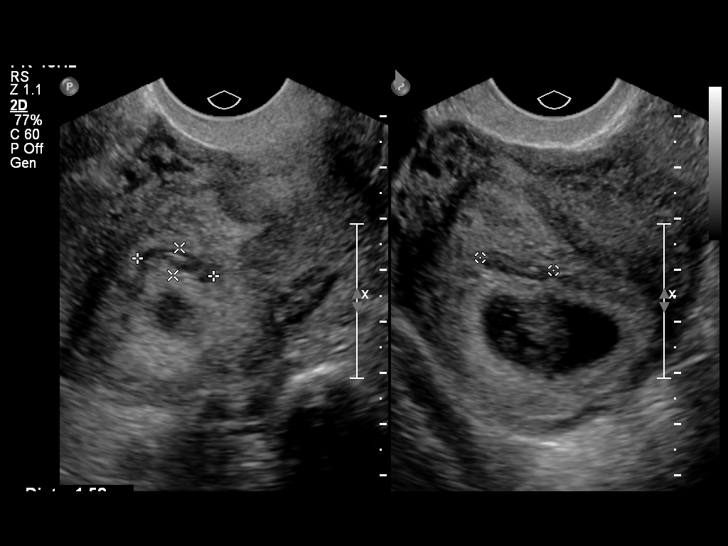
[im 24/35]
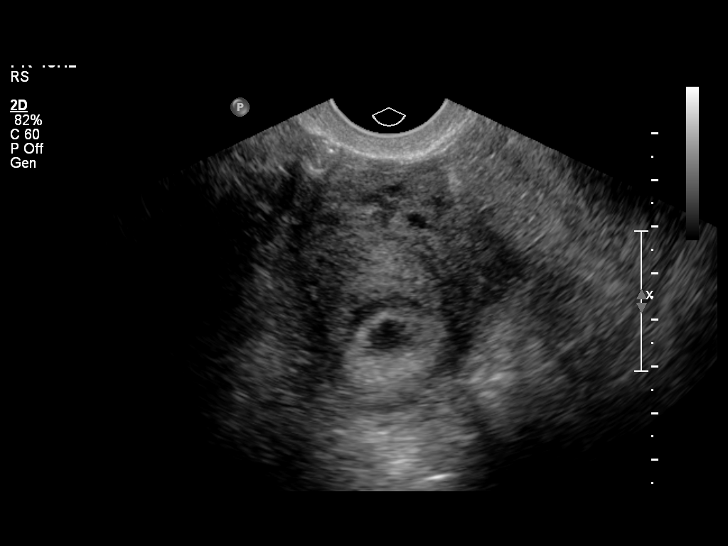
[im 27/35]
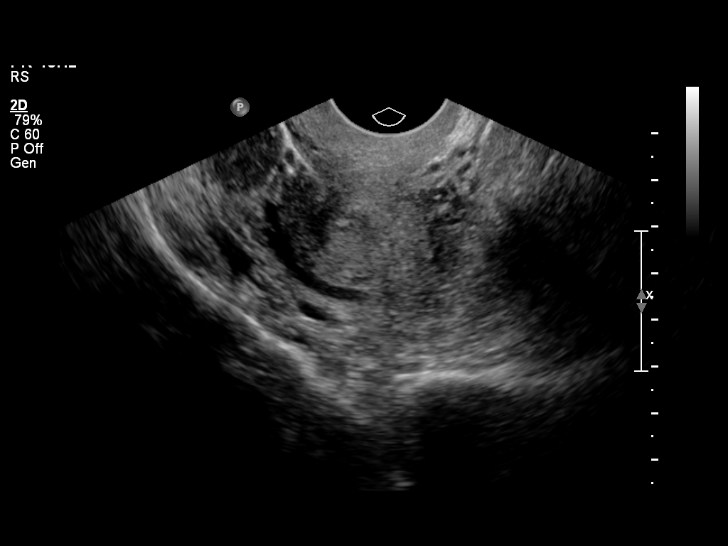
[im 29/35]
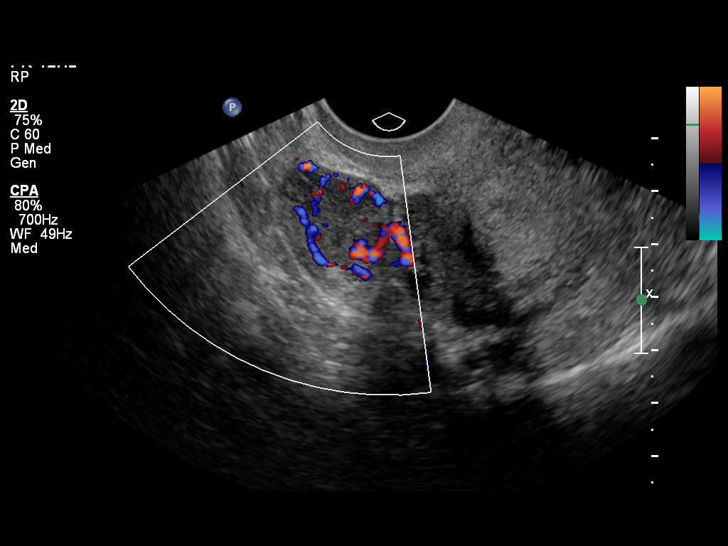
[im 32/35]
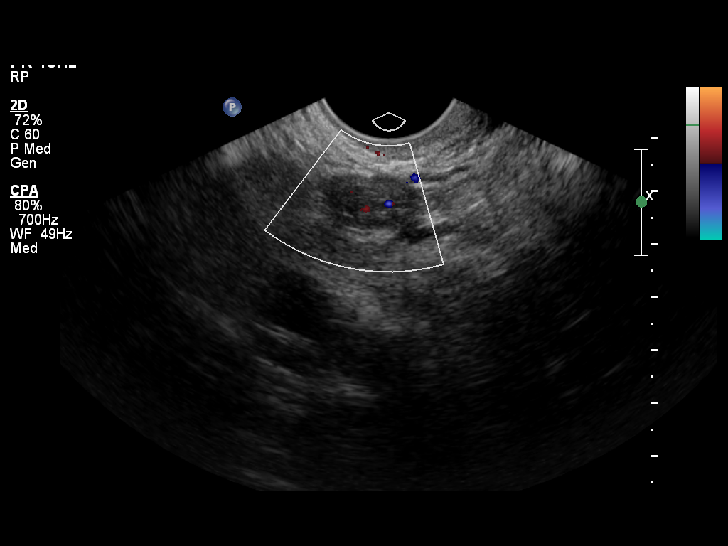
[im 35/35]
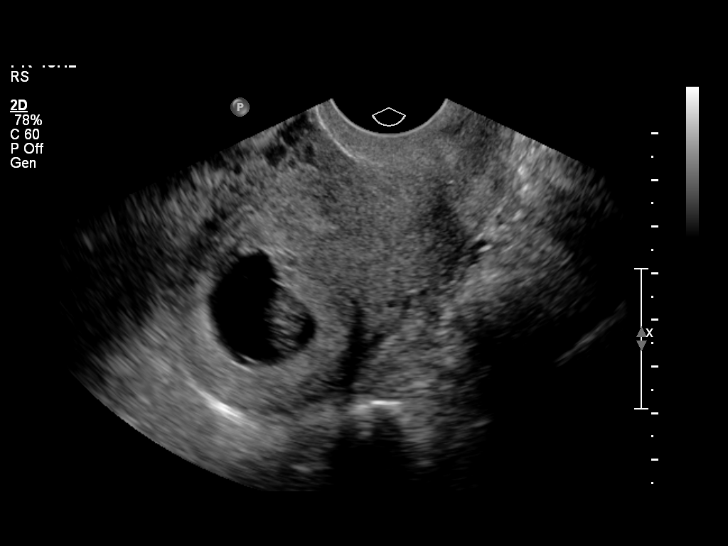

[14 of 28 positions shown; findings below may reference images not displayed]

FINDINGS: Intrauterine gestational sac: Visualized/normal in shape.

Yolk sac:  Present

Embryo:  Present

Cardiac Activity: Present

Heart Rate:  170 bpm

MSD:    mm    w     d

CRL:   17.2  mm   8 w 2 d                  US EDC: 06/11/2014

Maternal uterus/adnexae:

Small subchorionic hemorrhage.

Normal right ovary.  Corpus luteum cyst noted.

Normal left ovary.

No free pelvic fluid.
IMPRESSION: Single living intrauterine fetus estimated at 8 weeks and 2 days
gestation.

Small subchorionic hemorrhage.

Normal ovaries.
